# Patient Record
Sex: Male | Born: 1955 | Race: Black or African American | Hispanic: No | Smoking: Current every day smoker
Health system: Southern US, Community
[De-identification: ages and names within clinical notes are randomized; demographics above are authoritative.]

## PROBLEM LIST (undated history)

## (undated) DIAGNOSIS — M1990 Unspecified osteoarthritis, unspecified site: Secondary | ICD-10-CM

## (undated) HISTORY — PX: KNEE ARTHROSCOPY: SUR90

---

## 2013-12-14 ENCOUNTER — Other Ambulatory Visit: Payer: Self-pay | Admitting: Neurosurgery

## 2013-12-22 ENCOUNTER — Encounter (HOSPITAL_COMMUNITY): Payer: Self-pay

## 2013-12-25 ENCOUNTER — Encounter (HOSPITAL_COMMUNITY): Payer: Self-pay

## 2013-12-25 ENCOUNTER — Encounter (HOSPITAL_COMMUNITY)
Admission: RE | Admit: 2013-12-25 | Discharge: 2013-12-25 | Disposition: A | Discharge status: Home / Self Care | Payer: Non-veteran care | Source: Ambulatory Visit | Attending: Neurosurgery | Admitting: Neurosurgery

## 2013-12-25 DIAGNOSIS — Z01812 Encounter for preprocedural laboratory examination: Secondary | ICD-10-CM | POA: Insufficient documentation

## 2013-12-25 DIAGNOSIS — Z01818 Encounter for other preprocedural examination: Secondary | ICD-10-CM | POA: Insufficient documentation

## 2013-12-25 HISTORY — DX: Unspecified osteoarthritis, unspecified site: M19.90

## 2013-12-25 LAB — BASIC METABOLIC PANEL
BUN: 21 mg/dL (ref 6–23)
CALCIUM: 9.2 mg/dL (ref 8.4–10.5)
CO2: 26 mEq/L (ref 19–32)
CREATININE: 1.37 mg/dL — AB (ref 0.50–1.35)
Chloride: 104 mEq/L (ref 96–112)
GFR calc non Af Amer: 56 mL/min — ABNORMAL LOW (ref >90)
GFR, EST AFRICAN AMERICAN: 65 mL/min — AB (ref >90)
Glucose, Bld: 102 mg/dL — ABNORMAL HIGH (ref 70–99)
Potassium: 4.1 mEq/L (ref 3.7–5.3)
Sodium: 141 mEq/L (ref 137–147)

## 2013-12-25 LAB — CBC WITH DIFFERENTIAL/PLATELET
Basophils Absolute: 0 10*3/uL (ref 0.0–0.1)
Basophils Relative: 1 % (ref 0–1)
EOS ABS: 0.3 10*3/uL (ref 0.0–0.7)
EOS PCT: 5 % (ref 0–5)
HCT: 39.8 % (ref 39.0–52.0)
Hemoglobin: 13 g/dL (ref 13.0–17.0)
LYMPHS ABS: 2.1 10*3/uL (ref 0.7–4.0)
LYMPHS PCT: 31 % (ref 12–46)
MCH: 27.5 pg (ref 26.0–34.0)
MCHC: 32.7 g/dL (ref 30.0–36.0)
MCV: 84.1 fL (ref 78.0–100.0)
MONOS PCT: 8 % (ref 3–12)
Monocytes Absolute: 0.5 10*3/uL (ref 0.1–1.0)
Neutro Abs: 3.8 10*3/uL (ref 1.7–7.7)
Neutrophils Relative %: 56 % (ref 43–77)
PLATELETS: 165 10*3/uL (ref 150–400)
RBC: 4.73 MIL/uL (ref 4.22–5.81)
RDW: 14.9 % (ref 11.5–15.5)
WBC: 6.8 10*3/uL (ref 4.0–10.5)

## 2013-12-25 LAB — URINALYSIS, ROUTINE W REFLEX MICROSCOPIC
Bilirubin Urine: NEGATIVE
GLUCOSE, UA: NEGATIVE mg/dL
HGB URINE DIPSTICK: NEGATIVE
Ketones, ur: NEGATIVE mg/dL
LEUKOCYTES UA: NEGATIVE
Nitrite: NEGATIVE
PH: 6 (ref 5.0–8.0)
PROTEIN: NEGATIVE mg/dL
Specific Gravity, Urine: 1.018 (ref 1.005–1.030)
Urobilinogen, UA: 0.2 mg/dL (ref 0.0–1.0)

## 2013-12-25 LAB — SURGICAL PCR SCREEN
MRSA, PCR: NEGATIVE
Staphylococcus aureus: NEGATIVE

## 2013-12-25 LAB — ABO/RH: ABO/RH(D): O POS

## 2013-12-25 LAB — TYPE AND SCREEN
ABO/RH(D): O POS
Antibody Screen: NEGATIVE

## 2013-12-25 NOTE — Pre-Procedure Instructions (Signed)
Brandin V Paulson  12/25/2013   Your procedure is scheduled on:  12/29/13  [facility]  2 * 3 at 645 AM.  Call this number if you have problems the morning of surgery: [phone]   Remember:   Do not eat food or drink liquids after midnight.   Take these medicines the morning of surgery with A SIP OF WATER: flexeril,neurontin,oxycodone   Do not wear jewelry, make-up or nail polish.  Do not wear lotions, powders, or perfumes. You may wear deodorant.  Do not shave 48 hours prior to surgery. Men may shave face and neck.  Do not bring valuables to the hospital.  [facility]                  for any belongings or valuables.               Contacts, dentures or bridgework may not be worn into surgery.  Leave suitcase in the car. After surgery it may be brought to your room.  For patients admitted to the hospital, discharge time is determined by your                treatment team.               Patients discharged the day of surgery will not be allowed to drive  home.  Name and phone number of your driver:   Special Instructions: Shower using CHG 2 nights before surgery and the night before surgery.  If you shower the day of surgery use CHG.  Use special wash - you have one bottle of CHG for all showers.  You should use approximately 1/3 of the bottle for each shower.   Please read over the following fact sheets that you were given: Pain Booklet, Coughing and Deep Breathing, Blood Transfusion Information, MRSA Information and Surgical Site Infection Prevention

## 2013-12-28 MED ORDER — CEFAZOLIN SODIUM-DEXTROSE 2-3 GM-% IV SOLR
2.0000 g | INTRAVENOUS | Status: AC
  Administered 2013-12-29: 2 g via INTRAVENOUS
  Filled 2013-12-28: qty 50

## 2013-12-29 ENCOUNTER — Ambulatory Visit (HOSPITAL_COMMUNITY): Payer: Non-veteran care | Admitting: Certified Registered Nurse Anesthetist

## 2013-12-29 ENCOUNTER — Encounter (HOSPITAL_COMMUNITY): Payer: Non-veteran care | Admitting: Certified Registered Nurse Anesthetist

## 2013-12-29 ENCOUNTER — Encounter (HOSPITAL_COMMUNITY): Payer: Self-pay | Admitting: Surgery

## 2013-12-29 ENCOUNTER — Observation Stay (HOSPITAL_COMMUNITY)
Admission: RE | Admit: 2013-12-29 | Discharge: 2013-12-29 | Disposition: A | Discharge status: Home / Self Care | Payer: Non-veteran care | Source: Ambulatory Visit | Attending: Neurosurgery | Admitting: Neurosurgery

## 2013-12-29 ENCOUNTER — Encounter (HOSPITAL_COMMUNITY)
Admission: RE | Disposition: A | Discharge status: Home / Self Care | Payer: Self-pay | Source: Ambulatory Visit | Attending: Neurosurgery

## 2013-12-29 ENCOUNTER — Ambulatory Visit (HOSPITAL_COMMUNITY): Payer: Non-veteran care

## 2013-12-29 DIAGNOSIS — F17200 Nicotine dependence, unspecified, uncomplicated: Secondary | ICD-10-CM | POA: Diagnosis not present

## 2013-12-29 DIAGNOSIS — M5126 Other intervertebral disc displacement, lumbar region: Secondary | ICD-10-CM | POA: Diagnosis present

## 2013-12-29 HISTORY — PX: LUMBAR LAMINECTOMY/DECOMPRESSION MICRODISCECTOMY: SHX5026

## 2013-12-29 SURGERY — LUMBAR LAMINECTOMY/DECOMPRESSION MICRODISCECTOMY 1 LEVEL
Anesthesia: General | Site: Spine Lumbar | Laterality: Left

## 2013-12-29 MED ORDER — SODIUM CHLORIDE 0.9 % IJ SOLN
3.0000 mL | Freq: Two times a day (BID) | INTRAMUSCULAR | Status: DC
Start: 2013-12-29 — End: 2013-12-30

## 2013-12-29 MED ORDER — MORPHINE SULFATE 2 MG/ML IJ SOLN: 1.0000 mg | INTRAMUSCULAR | Status: DC | PRN

## 2013-12-29 MED ORDER — PHENYLEPHRINE HCL 10 MG/ML IJ SOLN
10.0000 mg | INTRAVENOUS | Status: DC | PRN
  Administered 2013-12-29: 50 ug/min via INTRAVENOUS

## 2013-12-29 MED ORDER — ACETAMINOPHEN 650 MG RE SUPP: 650.0000 mg | RECTAL | Status: DC | PRN

## 2013-12-29 MED ORDER — OXYCODONE-ACETAMINOPHEN 5-325 MG PO TABS: 1.0000 | ORAL_TABLET | ORAL | Status: DC | PRN

## 2013-12-29 MED ORDER — PROMETHAZINE HCL 25 MG PO TABS: 12.5000 mg | ORAL_TABLET | ORAL | Status: DC | PRN

## 2013-12-29 MED ORDER — ONDANSETRON HCL 4 MG/2ML IJ SOLN: 4.0000 mg | INTRAMUSCULAR | Status: DC | PRN

## 2013-12-29 MED ORDER — KETOROLAC TROMETHAMINE 30 MG/ML IJ SOLN
30.0000 mg | Freq: Four times a day (QID) | INTRAMUSCULAR | Status: DC
  Administered 2013-12-29: 30 mg via INTRAVENOUS
  Filled 2013-12-29 (×3): qty 1

## 2013-12-29 MED ORDER — HYDROCODONE-ACETAMINOPHEN 5-325 MG PO TABS: 1.0000 | ORAL_TABLET | ORAL | Status: DC | PRN

## 2013-12-29 MED ORDER — NEOSTIGMINE METHYLSULFATE 1 MG/ML IJ SOLN
INTRAMUSCULAR | Status: DC | PRN
  Administered 2013-12-29: 4 mg via INTRAVENOUS

## 2013-12-29 MED ORDER — ZOLPIDEM TARTRATE 5 MG PO TABS: 5.0000 mg | ORAL_TABLET | Freq: Every evening | ORAL | Status: DC | PRN

## 2013-12-29 MED ORDER — MIDAZOLAM HCL 5 MG/5ML IJ SOLN
INTRAMUSCULAR | Status: DC | PRN
Start: 2013-12-29 — End: 2013-12-29
  Administered 2013-12-29: 2 mg via INTRAVENOUS

## 2013-12-29 MED ORDER — 0.9 % SODIUM CHLORIDE (POUR BTL) OPTIME
TOPICAL | Status: DC | PRN
  Administered 2013-12-29: 1000 mL

## 2013-12-29 MED ORDER — LACTATED RINGERS IV SOLN
INTRAVENOUS | Status: DC
  Administered 2013-12-29: 07:00:00 via INTRAVENOUS

## 2013-12-29 MED ORDER — HYDROMORPHONE HCL PF 1 MG/ML IJ SOLN
0.2500 mg | INTRAMUSCULAR | Status: DC | PRN
  Administered 2013-12-29 (×2): 0.5 mg via INTRAVENOUS

## 2013-12-29 MED ORDER — HYDROMORPHONE HCL PF 1 MG/ML IJ SOLN
INTRAMUSCULAR | Status: AC
  Filled 2013-12-29: qty 1

## 2013-12-29 MED ORDER — SODIUM CHLORIDE 0.9 % IJ SOLN: 3.0000 mL | INTRAMUSCULAR | Status: DC | PRN

## 2013-12-29 MED ORDER — HEMOSTATIC AGENTS (NO CHARGE) OPTIME
TOPICAL | Status: DC | PRN
  Administered 2013-12-29: 1 via TOPICAL

## 2013-12-29 MED ORDER — SODIUM CHLORIDE 0.9 % IR SOLN
Status: DC | PRN
  Administered 2013-12-29: 11:00:00

## 2013-12-29 MED ORDER — LIDOCAINE HCL (CARDIAC) 20 MG/ML IV SOLN
INTRAVENOUS | Status: DC | PRN
  Administered 2013-12-29: 70 mg via INTRAVENOUS

## 2013-12-29 MED ORDER — GABAPENTIN 300 MG PO CAPS
600.0000 mg | ORAL_CAPSULE | Freq: Three times a day (TID) | ORAL | Status: DC
  Filled 2013-12-29 (×2): qty 2

## 2013-12-29 MED ORDER — LACTATED RINGERS IV SOLN
INTRAVENOUS | Status: DC | PRN
  Administered 2013-12-29 (×2): via INTRAVENOUS

## 2013-12-29 MED ORDER — ACETAMINOPHEN 325 MG PO TABS: 650.0000 mg | ORAL_TABLET | ORAL | Status: DC | PRN

## 2013-12-29 MED ORDER — CYCLOBENZAPRINE HCL 10 MG PO TABS: 10.0000 mg | ORAL_TABLET | Freq: Three times a day (TID) | ORAL | Status: DC | PRN

## 2013-12-29 MED ORDER — FENTANYL CITRATE 0.05 MG/ML IJ SOLN
INTRAMUSCULAR | Status: DC | PRN
  Administered 2013-12-29 (×4): 50 ug via INTRAVENOUS

## 2013-12-29 MED ORDER — LACTATED RINGERS IV SOLN: INTRAVENOUS | Status: DC

## 2013-12-29 MED ORDER — ONDANSETRON HCL 4 MG/2ML IJ SOLN: 4.0000 mg | Freq: Once | INTRAMUSCULAR | Status: DC | PRN

## 2013-12-29 MED ORDER — LIDOCAINE HCL 4 % MT SOLN
OROMUCOSAL | Status: DC | PRN
  Administered 2013-12-29: 4 mL via TOPICAL

## 2013-12-29 MED ORDER — ONDANSETRON HCL 4 MG/2ML IJ SOLN
INTRAMUSCULAR | Status: DC | PRN
  Administered 2013-12-29: 4 mg via INTRAVENOUS

## 2013-12-29 MED ORDER — METHOCARBAMOL 100 MG/ML IJ SOLN
500.0000 mg | Freq: Four times a day (QID) | INTRAVENOUS | Status: DC | PRN
  Filled 2013-12-29: qty 5

## 2013-12-29 MED ORDER — MAGNESIUM HYDROXIDE 400 MG/5ML PO SUSP: 30.0000 mL | Freq: Every day | ORAL | Status: DC | PRN

## 2013-12-29 MED ORDER — KETOROLAC TROMETHAMINE 30 MG/ML IJ SOLN
INTRAMUSCULAR | Status: AC
  Administered 2013-12-29: 30 mg
  Filled 2013-12-29: qty 1

## 2013-12-29 MED ORDER — PROPOFOL 10 MG/ML IV BOLUS
INTRAVENOUS | Status: DC | PRN
  Administered 2013-12-29: 200 mg via INTRAVENOUS

## 2013-12-29 MED ORDER — CEFAZOLIN SODIUM 1-5 GM-% IV SOLN
1.0000 g | Freq: Three times a day (TID) | INTRAVENOUS | Status: DC
  Filled 2013-12-29 (×2): qty 50

## 2013-12-29 MED ORDER — GLYCOPYRROLATE 0.2 MG/ML IJ SOLN
INTRAMUSCULAR | Status: DC | PRN
  Administered 2013-12-29: 0.4 mg via INTRAVENOUS

## 2013-12-29 MED ORDER — BISACODYL 10 MG RE SUPP: 10.0000 mg | Freq: Every day | RECTAL | Status: DC | PRN

## 2013-12-29 MED ORDER — LIDOCAINE-EPINEPHRINE 1 %-1:100000 IJ SOLN
INTRAMUSCULAR | Status: DC | PRN
  Administered 2013-12-29: 20 mL

## 2013-12-29 MED ORDER — PHENYLEPHRINE HCL 10 MG/ML IJ SOLN
INTRAMUSCULAR | Status: DC | PRN
Start: 2013-12-29 — End: 2013-12-29
  Administered 2013-12-29: 120 ug via INTRAVENOUS
  Administered 2013-12-29: 80 ug via INTRAVENOUS

## 2013-12-29 MED ORDER — METHOCARBAMOL 500 MG PO TABS
ORAL_TABLET | ORAL | Status: AC
  Filled 2013-12-29: qty 1

## 2013-12-29 MED ORDER — THROMBIN 5000 UNITS EX SOLR
CUTANEOUS | Status: DC | PRN
  Administered 2013-12-29 (×2): 5000 [IU] via TOPICAL

## 2013-12-29 MED ORDER — PROMETHAZINE HCL 25 MG/ML IJ SOLN: 12.5000 mg | INTRAMUSCULAR | Status: DC | PRN

## 2013-12-29 MED ORDER — METHOCARBAMOL 500 MG PO TABS
500.0000 mg | ORAL_TABLET | Freq: Four times a day (QID) | ORAL | Status: DC | PRN
  Administered 2013-12-29: 500 mg via ORAL
  Filled 2013-12-29: qty 1

## 2013-12-29 MED ORDER — NIACIN 500 MG PO TABS
500.0000 mg | ORAL_TABLET | Freq: Every day | ORAL | Status: DC
  Filled 2013-12-29: qty 1

## 2013-12-29 MED ORDER — ROCURONIUM BROMIDE 100 MG/10ML IV SOLN
INTRAVENOUS | Status: DC | PRN
  Administered 2013-12-29: 50 mg via INTRAVENOUS

## 2013-12-29 MED ORDER — DOCUSATE SODIUM 100 MG PO CAPS
100.0000 mg | ORAL_CAPSULE | Freq: Two times a day (BID) | ORAL | Status: DC
  Filled 2013-12-29: qty 1

## 2013-12-29 SURGICAL SUPPLY — 52 items
BAG DECANTER FOR FLEXI CONT (MISCELLANEOUS) ×3 IMPLANT
BENZOIN TINCTURE PRP APPL 2/3 (GAUZE/BANDAGES/DRESSINGS) ×3 IMPLANT
BLADE SURG ROTATE 9660 (MISCELLANEOUS) IMPLANT
BUR ROUND FLUTED 5 RND (BURR) ×2 IMPLANT
BUR ROUND FLUTED 5MM RND (BURR) ×1
CANISTER SUCT 3000ML (MISCELLANEOUS) ×3 IMPLANT
CLOSURE WOUND 1/2 X4 (GAUZE/BANDAGES/DRESSINGS) ×1
CONT SPEC 4OZ CLIKSEAL STRL BL (MISCELLANEOUS) ×3 IMPLANT
DRAPE LAPAROTOMY 100X72X124 (DRAPES) ×3 IMPLANT
DRAPE MICROSCOPE LEICA (MISCELLANEOUS) ×3 IMPLANT
DRAPE POUCH INSTRU U-SHP 10X18 (DRAPES) ×3 IMPLANT
DRAPE SURG 17X23 STRL (DRAPES) ×3 IMPLANT
DRESSING TELFA 8X3 (GAUZE/BANDAGES/DRESSINGS) ×3 IMPLANT
DURAPREP 26ML APPLICATOR (WOUND CARE) ×3 IMPLANT
ELECT REM PT RETURN 9FT ADLT (ELECTROSURGICAL) ×3
ELECTRODE REM PT RTRN 9FT ADLT (ELECTROSURGICAL) ×1 IMPLANT
GAUZE SPONGE 4X4 16PLY XRAY LF (GAUZE/BANDAGES/DRESSINGS) IMPLANT
GLOVE BIOGEL PI IND STRL 7.5 (GLOVE) ×3 IMPLANT
GLOVE BIOGEL PI INDICATOR 7.5 (GLOVE) ×6
GLOVE ECLIPSE 7.5 STRL STRAW (GLOVE) ×6 IMPLANT
GLOVE EXAM NITRILE LRG STRL (GLOVE) IMPLANT
GLOVE EXAM NITRILE MD LF STRL (GLOVE) IMPLANT
GLOVE EXAM NITRILE XL STR (GLOVE) IMPLANT
GLOVE EXAM NITRILE XS STR PU (GLOVE) IMPLANT
GLOVE SURG SS PI 7.0 STRL IVOR (GLOVE) ×9 IMPLANT
GOWN BRE IMP SLV AUR LG STRL (GOWN DISPOSABLE) ×9 IMPLANT
GOWN BRE IMP SLV AUR XL STRL (GOWN DISPOSABLE) ×3 IMPLANT
GOWN STRL REIN 2XL LVL4 (GOWN DISPOSABLE) IMPLANT
KIT BASIN OR (CUSTOM PROCEDURE TRAY) ×3 IMPLANT
KIT ROOM TURNOVER OR (KITS) ×3 IMPLANT
NEEDLE HYPO 18GX1.5 BLUNT FILL (NEEDLE) IMPLANT
NEEDLE HYPO 22GX1.5 SAFETY (NEEDLE) ×6 IMPLANT
NS IRRIG 1000ML POUR BTL (IV SOLUTION) ×3 IMPLANT
PACK LAMINECTOMY NEURO (CUSTOM PROCEDURE TRAY) ×3 IMPLANT
PAD ARMBOARD 7.5X6 YLW CONV (MISCELLANEOUS) ×15 IMPLANT
PATTIES SURGICAL .75X.75 (GAUZE/BANDAGES/DRESSINGS) ×3 IMPLANT
RUBBERBAND STERILE (MISCELLANEOUS) ×6 IMPLANT
SPONGE GAUZE 4X4 12PLY (GAUZE/BANDAGES/DRESSINGS) ×3 IMPLANT
SPONGE LAP 4X18 X RAY DECT (DISPOSABLE) IMPLANT
SPONGE SURGIFOAM ABS GEL SZ50 (HEMOSTASIS) ×3 IMPLANT
STRIP CLOSURE SKIN 1/2X4 (GAUZE/BANDAGES/DRESSINGS) ×2 IMPLANT
SUT PROLENE 6 0 BV (SUTURE) IMPLANT
SUT VIC AB 0 CT1 18XCR BRD8 (SUTURE) ×1 IMPLANT
SUT VIC AB 0 CT1 8-18 (SUTURE) ×2
SUT VIC AB 2-0 CP2 18 (SUTURE) ×3 IMPLANT
SUT VIC AB 3-0 SH 8-18 (SUTURE) ×3 IMPLANT
SYR 20ML ECCENTRIC (SYRINGE) ×3 IMPLANT
SYR 5ML LL (SYRINGE) IMPLANT
TAPE CLOTH SURG 4X10 WHT LF (GAUZE/BANDAGES/DRESSINGS) ×3 IMPLANT
TOWEL OR 17X24 6PK STRL BLUE (TOWEL DISPOSABLE) ×3 IMPLANT
TOWEL OR 17X26 10 PK STRL BLUE (TOWEL DISPOSABLE) ×3 IMPLANT
WATER STERILE IRR 1000ML POUR (IV SOLUTION) ×3 IMPLANT

## 2013-12-29 NOTE — Anesthesia Preprocedure Evaluation (Addendum)
Anesthesia Evaluation  Patient identified by MRN, date of birth, ID band Patient awake    Reviewed: Allergy & Precautions, H&P , NPO status , Patient's Chart, lab work & pertinent test results  Airway       Dental   Pulmonary Current Smoker,          Cardiovascular     Neuro/Psych    GI/Hepatic   Endo/Other    Renal/GU      Musculoskeletal   Abdominal   Peds  Hematology   Anesthesia Other Findings   Reproductive/Obstetrics                           Anesthesia Physical Anesthesia Plan  ASA: I  Anesthesia Plan: General   Post-op Pain Management:    Induction: Intravenous  Airway Management Planned: Oral ETT  Additional Equipment:   Intra-op Plan:   Post-operative Plan: Extubation in OR  Informed Consent: I have reviewed the patients History and Physical, chart, labs and discussed the procedure including the risks, benefits and alternatives for the proposed anesthesia with the patient or authorized representative who has indicated his/her understanding and acceptance.     Plan Discussed with:   Anesthesia Plan Comments:         Anesthesia Quick Evaluation  

## 2013-12-29 NOTE — Progress Notes (Signed)
Lunch relief by R. Hunt RN 

## 2013-12-29 NOTE — Transfer of Care (Signed)
Immediate Anesthesia Transfer of Care Note  Patient: Jose Wilkerson  Procedure(s) Performed: Procedure(s) with comments: LEFT LUMBAR FIVE-SACRAL ONE MICRODISCECTOMY (Left) - LEFT   Patient Location: PACU  Anesthesia Type:General  Level of Consciousness: sedated  Airway & Oxygen Therapy: Patient Spontanous Breathing and Patient connected to nasal cannula oxygen  Post-op Assessment: Report given to PACU RN, Post -op Vital signs reviewed and stable and Patient moving all extremities  Post vital signs: Reviewed and stable  Complications: No apparent anesthesia complications

## 2013-12-29 NOTE — Anesthesia Postprocedure Evaluation (Signed)
  Anesthesia Post-op Note  Patient: Jose Wilkerson  Procedure(s) Performed: Procedure(s) with comments: LEFT LUMBAR FIVE-SACRAL ONE MICRODISCECTOMY (Left) - LEFT   Patient Location: PACU  Anesthesia Type:General  Level of Consciousness: awake, alert , sedated and patient cooperative  Airway and Oxygen Therapy: Patient Spontanous Breathing  Post-op Pain: mild  Post-op Assessment: Post-op Vital signs reviewed, Patient's Cardiovascular Status Stable, Respiratory Function Stable, Patent Airway, No signs of Nausea or vomiting and Pain level controlled  Post-op Vital Signs: stable  Complications: No apparent anesthesia complications

## 2013-12-29 NOTE — Op Note (Signed)
12/29/2013  12:10 PM  PATIENT:  Jose Wilkerson  58 y.o. 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 A long-term of back problems and various injection facet blocks the facet recovery but the use left-sided symptoms become more severe she or recently and MRI was done in September showed new left-sided disc herniation of 5 S1 top of severe spinal change causing lateral recess stenosis and nerve root compression patient is not improved through this., Lamina we could to proceed with decompressive laminectomy and discectomy.  Patient brought operative general she induced patient placed in prone position Wilson frame all pressure points padded. Patient prepped draped sterile fashion 7 incision injected with 20 cc morcellized with epinephrine. Needle was placed interspace x-rays attention needle was putting at the 5 one space incision was then made centered with needle was incision taken the fascia hemostasis obtained with Bovie cauterization fascia was incised and subperiosteal dissection done with L5 and S1 spinous process lamina out to the facets are became retractors placed markers placed interspace another x-rays obtained confirming or positioning. Microscope brought from our dissection this point high-speed drill was used to search semi-hemilaminectomy medial facetectomy is completed with Kerrison punches a foraminotomy done over the S1 root and removed ligament flavum the and explore the epidural space finding large disc herniation up under  the S1 root and remove the disc herniation we incised the displacement discectomy with pituitary rongeurs and curettes and remove osteophytes with [2 were finished we did decompression the central canal and the 5 and S1 nerve root foraminotomies was done of the 5 root to person facet hypertrophy that was causing some foraminal stenosis or. We did hemostasis with bipolar cauterization and Gelfoam thrombin Gelfoam was. Out we. About solution we can check nerve roots and very good decompression retractors removed fascia closed with 0 Vicryl interrupted sutures excess tissue closed with 02 over 0 Vicryl interrupted sutures skin closed benzoin Steri-Strips dressing was placed patient placed back in spine position woken from anesthesia and transferred to recovery.  PLAN OF CARE: Admit for overnight observation  PATIENT DISPOSITION:  PACU - hemodynamically stable.

## 2013-12-29 NOTE — Progress Notes (Signed)
Pt. Alert and oriented, follows simple instructions, denies pain. Incision area without swelling, redness or S/S of infection. Voiding adequate clear yellow urine. Moving all extremities well and vitals stable and documented. Lumbar surgery notes instructions given to patient and family member for home safety and precautions. Pt. and family stated understanding of instructions given. Extra supplies given to patient at discharged.

## 2013-12-29 NOTE — Discharge Instructions (Signed)
Wound Care °Keep incision covered and dry for 5 days.   °You may remove outer bandage after 5 days.  °Do not put any creams, lotions, or ointments on incision. °Leave steri-strips on back.  They will fall off by themselves. °Activity °Walk each and every day, increasing distance each day. °No lifting greater than 5 lbs.  Avoid sitting for long periods. °No driving for 2 weeks; may ride as a passenger locally. °  °Diet °Resume your normal diet.  °Return to Work °Will be discussed at you follow up appointment. °Call Your Doctor If Any of These Occur °Redness, drainage, or swelling at the wound.  °Temperature greater than 101 degrees. °Severe pain not relieved by pain medication. °Incision starts to come apart. °Follow Up Appt °Call today for appointment in 3-4weeks ([phone]) or for problems.  If you have any hardware placed in your spine, you will need an x-ray before your appointment. ° °

## 2013-12-29 NOTE — Discharge Summary (Signed)
Physician Discharge Summary  Patient ID: Jose Wilkerson MRN: [id number] DOB/AGE: 25-Aug-1956 58 y.o.  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 *   Discharged Condition: good  Hospital Course: pt admitted on day of surgery  - underwent procedure below  - pt with less leg pain , ambulating, voiding, tolerating PO  Consults: None    Treatments: surgery: LEFT LUMBAR FIVE-SACRAL ONE semihemilaminectomy, discectomy , microdisection with microscope   Discharge Exam: Blood pressure 149/111, pulse 64, temperature 97.4 F (36.3 C), temperature source Oral, resp. rate 18, SpO2 100.00%. Wound: c/d/i  Disposition: home     Medication List         acetaminophen 325 MG tablet  Commonly known as:  TYLENOL  Take 650 mg by mouth every 6 (six) hours as needed.     cyclobenzaprine 10 MG tablet  Commonly known as:  FLEXERIL  Take 10 mg by mouth 3 (three) times daily as needed for muscle spasms.     diphenhydrAMINE 25 MG tablet  Commonly known as:  SOMINEX  Take 50 mg by mouth at bedtime as needed for sleep.     docusate sodium 100 MG capsule  Commonly known as:  COLACE  Take 100 mg by mouth daily as needed for mild constipation.     gabapentin 300 MG capsule  Commonly known as:  NEURONTIN  Take 600 mg by mouth 3 (three) times daily.     multivitamin with minerals tablet  Take 1 tablet by mouth daily.     naproxen 500 MG tablet  Commonly known as:  NAPROSYN  Take 500 mg by mouth 2 (two) times daily as needed for mild pain.     niacin 500 MG tablet  Take 500 mg by mouth at bedtime.     oxycodone 5 MG capsule  Commonly known as:  OXY-IR  Take 10 mg by mouth every 6 (six) hours as needed.     traMADol 50 MG tablet  Commonly known as:  ULTRAM  Take 50 mg by mouth every 6 (six) hours as  needed.     VITAMIN D PO  Take 1 tablet by mouth daily.         SignedClydene Fake: Lorain Keast R, MD 12/29/2013, 12:14 PM

## 2013-12-29 NOTE — Preoperative (Signed)
Beta Blockers   Reason not to administer Beta Blockers:Not Applicable 

## 2013-12-29 NOTE — H&P (Signed)
See H& P.

## 2013-12-29 NOTE — Anesthesia Procedure Notes (Signed)
Procedure Name: Intubation Date/Time: 12/29/2013 10:32 AM Performed by: Trixie Deis A Pre-anesthesia Checklist: Patient identified, Timeout performed, Emergency Drugs available, Suction available and Patient being monitored Patient Re-evaluated:Patient Re-evaluated prior to inductionOxygen Delivery Method: Circle system utilized Preoxygenation: Pre-oxygenation with 100% oxygen Intubation Type: IV induction Ventilation: Mask ventilation without difficulty Laryngoscope Size: Mac and 4 Grade View: Grade I Tube type: Oral Tube size: 7.5 mm Number of attempts: 1 Airway Equipment and Method: Stylet and LTA kit utilized Placement Confirmation: ETT inserted through vocal cords under direct vision,  breath sounds checked- equal and bilateral and positive ETCO2 Secured at: 22 cm Tube secured with: Tape Dental Injury: Teeth and Oropharynx as per pre-operative assessment

## 2013-12-29 NOTE — Interval H&P Note (Signed)
History and Physical Interval Note:  12/29/2013 7:28 AM  Jose Wilkerson  has presented today for surgery, with the diagnosis of Lumbar Hnp without myelopathy  The various methods of treatment have been discussed with the patient and family. After consideration of risks, benefits and other options for treatment, the patient has consented to  Procedure(s) with comments: LUMBAR LAMINECTOMY/DECOMPRESSION MICRODISCECTOMY 1 LEVEL (Left) - Left L5-S1 Microdiskectomy as a surgical intervention .  The patient's history has been reviewed, patient examined, no change in status, stable for surgery.  I have reviewed the patient's chart and labs.  Questions were answered to the patient's satisfaction.     [name]

## 2013-12-29 NOTE — Plan of Care (Signed)
Problem: Consults Goal: Diagnosis - Spinal Surgery Outcome: Completed/Met Date Met:  12/29/13 Microdiscectomy     

## 2013-12-30 ENCOUNTER — Encounter (HOSPITAL_COMMUNITY): Payer: Self-pay | Admitting: Neurosurgery

## 2014-07-23 IMAGING — DX DG LUMBAR SPINE 2-3V
1 series · 1 of 1 positions shown · non-contrast
Comparison: Lumbar spine 09/08/2013.

CLINICAL DATA: L5-S1 laminectomy/discectomy.

EXAM:
LUMBAR SPINE - 2-3 VIEW

[lat]
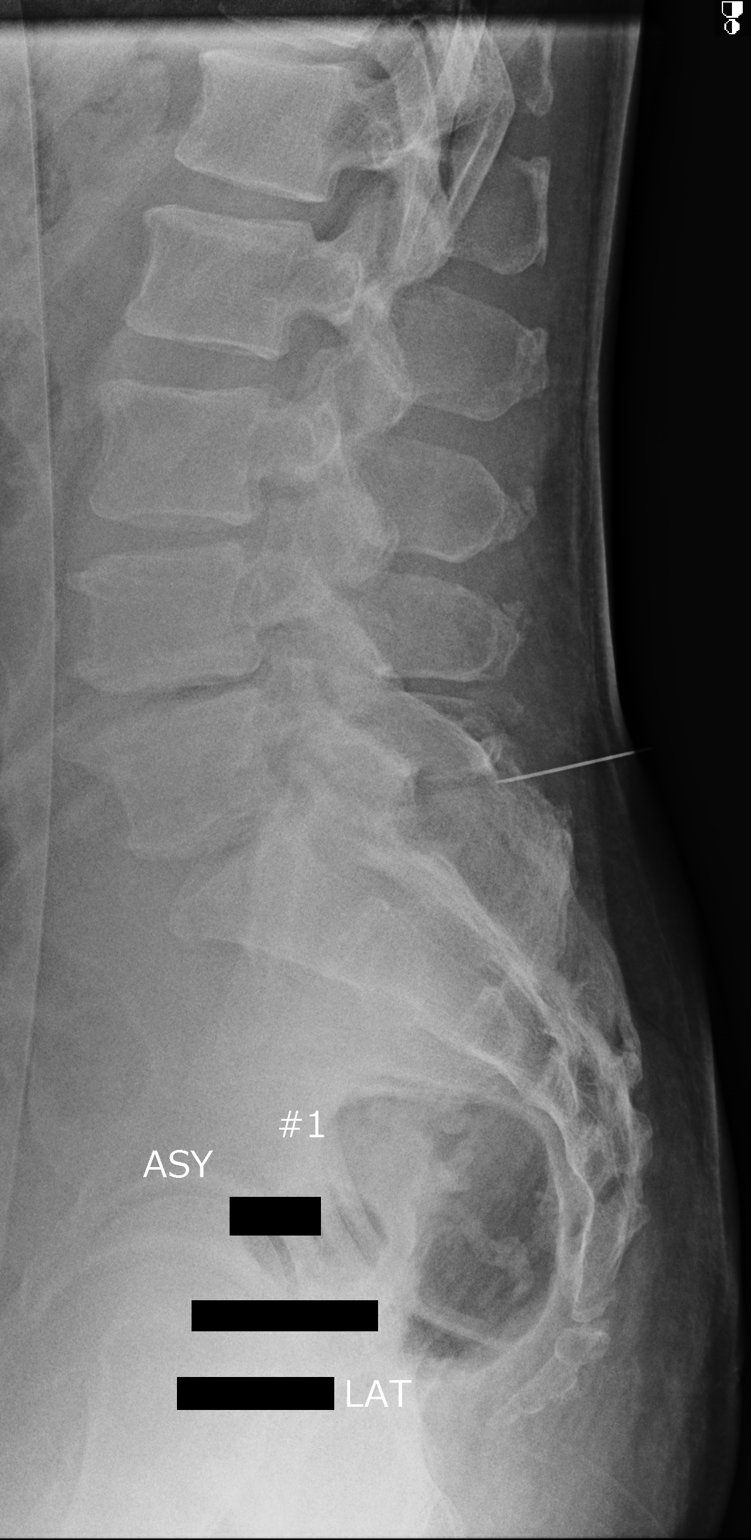

[1 of 1 positions shown; findings below may reference images not displayed]

FINDINGS: Vertebral body alignment and heights are normal. There is mild to
moderate spondylosis present. There is disc space narrowing at the
L3-4, L4-5 and L5-S1 levels. Metallic surgical instrument is seen
over the posterior elements at the L5-S1 level. Recommend
correlation with findings at the time of the procedure.
IMPRESSION: Surgical instrument over the posterior elements at the L5-S1 level.
Recommend correlation with findings at the time of the procedure.

Mild moderate spondylosis with multilevel degenerative disc disease.
